# Patient Record
Sex: Male | Born: 1985 | Hispanic: Yes | Marital: Married | Smoking: Never smoker
Health system: Southern US, Community
[De-identification: ages and names within clinical notes are randomized; demographics above are authoritative.]

---

## 2016-08-06 ENCOUNTER — Emergency Department (HOSPITAL_COMMUNITY)
Admission: EM | Admit: 2016-08-06 | Discharge: 2016-08-06 | Disposition: A | Discharge status: Home / Self Care | Payer: Self-pay | Attending: Emergency Medicine | Admitting: Emergency Medicine

## 2016-08-06 ENCOUNTER — Emergency Department (HOSPITAL_COMMUNITY): Payer: Self-pay

## 2016-08-06 ENCOUNTER — Encounter (HOSPITAL_COMMUNITY): Payer: Self-pay

## 2016-08-06 DIAGNOSIS — K5792 Diverticulitis of intestine, part unspecified, without perforation or abscess without bleeding: Secondary | ICD-10-CM | POA: Insufficient documentation

## 2016-08-06 LAB — LIPASE, BLOOD: Lipase: 14 U/L (ref 11–51)

## 2016-08-06 LAB — CBC
HCT: 45.1 % (ref 39.0–52.0)
HEMOGLOBIN: 15.6 g/dL (ref 13.0–17.0)
MCH: 29.4 pg (ref 26.0–34.0)
MCHC: 34.6 g/dL (ref 30.0–36.0)
MCV: 85.1 fL (ref 78.0–100.0)
PLATELETS: 306 10*3/uL (ref 150–400)
RBC: 5.3 MIL/uL (ref 4.22–5.81)
RDW: 13 % (ref 11.5–15.5)
WBC: 9.7 10*3/uL (ref 4.0–10.5)

## 2016-08-06 LAB — COMPREHENSIVE METABOLIC PANEL
ALK PHOS: 90 U/L (ref 38–126)
ALT: 41 U/L (ref 17–63)
ANION GAP: 8 (ref 5–15)
AST: 30 U/L (ref 15–41)
Albumin: 4.4 g/dL (ref 3.5–5.0)
BUN: 14 mg/dL (ref 6–20)
CALCIUM: 9.5 mg/dL (ref 8.9–10.3)
CO2: 25 mmol/L (ref 22–32)
CREATININE: 0.84 mg/dL (ref 0.61–1.24)
Chloride: 105 mmol/L (ref 101–111)
Glucose, Bld: 100 mg/dL — ABNORMAL HIGH (ref 65–99)
Potassium: 4.1 mmol/L (ref 3.5–5.1)
Sodium: 138 mmol/L (ref 135–145)
TOTAL PROTEIN: 7.1 g/dL (ref 6.5–8.1)
Total Bilirubin: 0.9 mg/dL (ref 0.3–1.2)

## 2016-08-06 LAB — URINALYSIS, ROUTINE W REFLEX MICROSCOPIC
Bilirubin Urine: NEGATIVE
Glucose, UA: NEGATIVE mg/dL
HGB URINE DIPSTICK: NEGATIVE
Ketones, ur: NEGATIVE mg/dL
LEUKOCYTES UA: NEGATIVE
NITRITE: NEGATIVE
PROTEIN: NEGATIVE mg/dL
Specific Gravity, Urine: 1.02 (ref 1.005–1.030)
pH: 5 (ref 5.0–8.0)

## 2016-08-06 MED ORDER — IOPAMIDOL (ISOVUE-300) INJECTION 61%
INTRAVENOUS | Status: AC
  Administered 2016-08-06: 100 mL
  Filled 2016-08-06: qty 100

## 2016-08-06 MED ORDER — CIPROFLOXACIN HCL 500 MG PO TABS: 500.0000 mg | ORAL_TABLET | Freq: Two times a day (BID) | ORAL | 0 refills | Status: DC

## 2016-08-06 MED ORDER — ONDANSETRON 4 MG PO TBDP: 4.0000 mg | ORAL_TABLET | Freq: Three times a day (TID) | ORAL | 0 refills | Status: DC | PRN

## 2016-08-06 MED ORDER — HYDROMORPHONE HCL 1 MG/ML IJ SOLN
1.0000 mg | Freq: Once | INTRAMUSCULAR | Status: AC
  Administered 2016-08-06: 1 mg via INTRAVENOUS
  Filled 2016-08-06: qty 1

## 2016-08-06 MED ORDER — ONDANSETRON HCL 4 MG/2ML IJ SOLN
4.0000 mg | Freq: Once | INTRAMUSCULAR | Status: AC
  Administered 2016-08-06: 4 mg via INTRAVENOUS
  Filled 2016-08-06: qty 2

## 2016-08-06 MED ORDER — METRONIDAZOLE 500 MG PO TABS: 500.0000 mg | ORAL_TABLET | Freq: Two times a day (BID) | ORAL | 0 refills | Status: DC

## 2016-08-06 MED ORDER — OXYCODONE-ACETAMINOPHEN 5-325 MG PO TABS: 2.0000 | ORAL_TABLET | ORAL | 0 refills | Status: DC | PRN

## 2016-08-06 NOTE — Discharge Instructions (Signed)
Return if any problems.  Follow up with Gi for recheck in 2-3 weeks

## 2016-08-06 NOTE — ED Notes (Signed)
Pt transferred to CT.

## 2016-08-06 NOTE — ED Triage Notes (Signed)
Pt reports LLQ abdominal pain, "it feels like I'm constipated but I'm not." LBM last night; pain onset two days ago. Denies n/v.

## 2016-08-06 NOTE — ED Notes (Signed)
Pt asked for food. Per PA "not yet". Pt a/o, comfortable.

## 2016-08-06 NOTE — ED Provider Notes (Signed)
[facility]-EMERGENCY DEPT Provider Note   CSN: [id number] Arrival date & time: 08/06/16  0910     History   Chief Complaint Chief Complaint  Patient presents with  . Abdominal Pain    HPI Brandon Valentine is a 31 y.o. male.  The history is provided by the patient. No language interpreter was used.  Abdominal Pain   This is a new problem. The current episode started more than 2 days ago. The problem occurs constantly. The problem has been gradually worsening. The pain is associated with eating. The pain is located in the LLQ. The quality of the pain is aching. The pain is at a severity of 6/10. The pain is moderate. Pertinent negatives include anorexia, diarrhea, vomiting and constipation. Nothing aggravates the symptoms. Nothing relieves the symptoms. Past workup does not include GI consult. His past medical history does not include PUD.  Pt complains of pain in his left lower abdomen after eating fish on Sunday. Pt reports he feels constipated but is having normal bowel movements.  No fever or chills,   History reviewed. No pertinent past medical history.  There are no active problems to display for this patient.   History reviewed. No pertinent surgical history.     Home Medications    Prior to Admission medications   Not on File    Family History No family history on file.  Social History Social History  Substance Use Topics  . Smoking status: Never Smoker  . Smokeless tobacco: Never Used  . Alcohol use No     Allergies   Patient has no allergy information on record.   Review of Systems Review of Systems  Gastrointestinal: Positive for abdominal pain. Negative for anorexia, constipation, diarrhea and vomiting.  All other systems reviewed and are negative.    Physical Exam Updated Vital Signs BP 100/68   Pulse 77   Temp 98.5 F (36.9 C) (Oral)   Resp (!) 23   SpO2 97%   Physical Exam  Constitutional: He appears well-developed and  well-nourished.  HENT:  Head: Normocephalic.  Right Ear: External ear normal.  Left Ear: External ear normal.  Eyes: Conjunctivae are normal. Pupils are equal, round, and reactive to light.  Neck: Normal range of motion.  Cardiovascular: Normal rate and regular rhythm.   Pulmonary/Chest: Effort normal.  Abdominal: Soft. There is tenderness.  Musculoskeletal: Normal range of motion.  Neurological: He is alert.  Skin: Skin is warm.  Nursing note and vitals reviewed.    ED Treatments / Results  Labs (all labs ordered are listed, but only abnormal results are displayed) Labs Reviewed  COMPREHENSIVE METABOLIC PANEL - Abnormal; Notable for the following:       Result Value   Glucose, Bld 100 (*)    All other components within normal limits  LIPASE, BLOOD  CBC  URINALYSIS, ROUTINE W REFLEX MICROSCOPIC    EKG  EKG Interpretation None       Radiology Ct Abdomen Pelvis W Contrast  Result Date: 08/06/2016 CLINICAL DATA:  Left lower quadrant pain for 2 days. EXAM: CT ABDOMEN AND PELVIS WITH CONTRAST TECHNIQUE: Multidetector CT imaging of the abdomen and pelvis was performed using the standard protocol following bolus administration of intravenous contrast. CONTRAST:  ISOVUE-300 IOPAMIDOL (ISOVUE-300) INJECTION 61% COMPARISON:  None. FINDINGS: Lower Chest: No acute findings. Hepatobiliary:  No masses identified. Gallbladder is unremarkable. Pancreas:  No mass or inflammatory changes. Spleen: Within normal limits in size and appearance. Adrenals/Urinary Tract: No masses identified. No evidence  of hydronephrosis. Stomach/Bowel: Mild diverticulosis with associated pericolonic soft tissue stranding is seen involving the proximal sigmoid colon, consistent with mild diverticulitis. No evidence of abscess or free fluid. No evidence of bowel obstruction. Vascular/Lymphatic: No pathologically enlarged lymph nodes. No abdominal aortic aneurysm. Reproductive:  No mass or other significant  abnormality. Other:  None. Musculoskeletal: No suspicious bone lesions identified. Bilateral L5 spondylolysis noted, without spondylolisthesis. IMPRESSION: Mild diverticulitis of the proximal sigmoid colon. No evidence of abscess or other complication. Electronically Signed   By: Myles Rosenthal M.D.   On: 08/06/2016 13:08    Procedures Procedures (including critical care time)  Medications Ordered in ED Medications  iopamidol (ISOVUE-300) 61 % injection (100 mLs  Contrast Given 08/06/16 1251)     Initial Impression / Assessment and Plan / ED Course  I have reviewed the triage vital signs and the nursing notes.  Pertinent labs & imaging results that were available during my care of the patient were reviewed by me and considered in my medical decision making (see chart for details).       Final Clinical Impressions(s) / ED Diagnoses   Final diagnoses:  Diverticulitis of intestine without perforation or abscess without bleeding, unspecified part of intestinal tract    New Prescriptions New Prescriptions   CIPROFLOXACIN (CIPRO) 500 MG TABLET    Take 1 tablet (500 mg total) by mouth 2 (two) times daily.   METRONIDAZOLE (FLAGYL) 500 MG TABLET    Take 1 tablet (500 mg total) by mouth 2 (two) times daily.   ONDANSETRON (ZOFRAN ODT) 4 MG DISINTEGRATING TABLET    Take 1 tablet (4 mg total) by mouth every 8 (eight) hours as needed for nausea or vomiting.   OXYCODONE-ACETAMINOPHEN (PERCOCET/ROXICET) 5-325 MG TABLET    Take 2 tablets by mouth every 4 (four) hours as needed for severe pain.  An After Visit Summary was printed and given to the patient.    Lonia Skinner [place], PA-C 08/06/16 1352    Mancel Bale, MD 08/10/16 [phone]

## 2018-11-04 IMAGING — CT CT ABD-PELV W/ CM
2 of 4 series · 16 of 46 positions shown, 18 images · IV contrast (Omni 300)
Comparison: None.

CLINICAL DATA: Left lower quadrant pain for 2 days.

EXAM:
CT ABDOMEN AND PELVIS WITH CONTRAST
TECHNIQUE: Multidetector CT imaging of the abdomen and pelvis was performed
using the standard protocol following bolus administration of
intravenous contrast.
CONTRAST:  100mL 20FKCZ-5MM IOPAMIDOL (20FKCZ-5MM) INJECTION 61%

[Series 3: a/p w/ 5mm · axial · 0.88mm/px · z∈[-478,-28]mm · 13 of 100 slices shown, 15 images]
[im 5/100  soft-tissue]
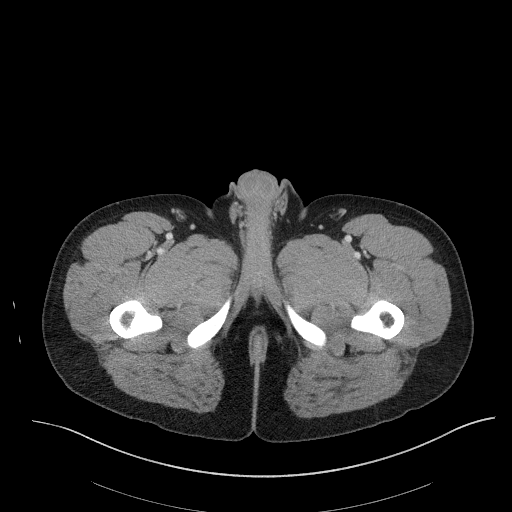
[im 5/100  bone]
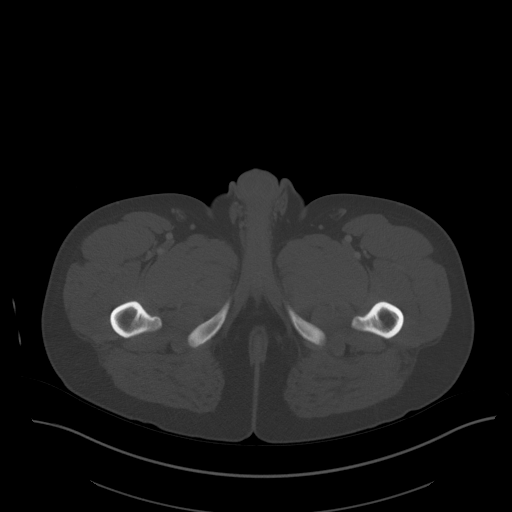
[im 15/100  soft-tissue]
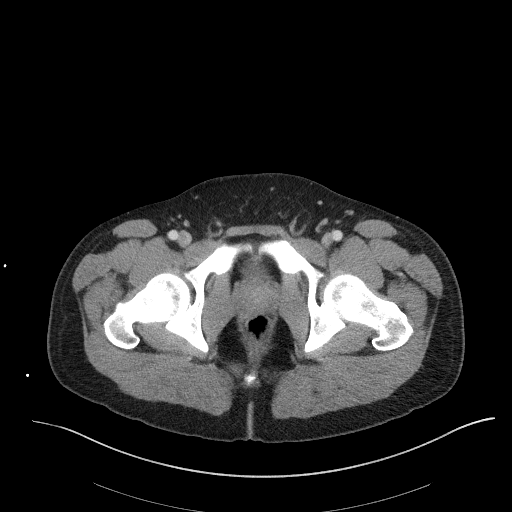
[im 19/100  soft-tissue]
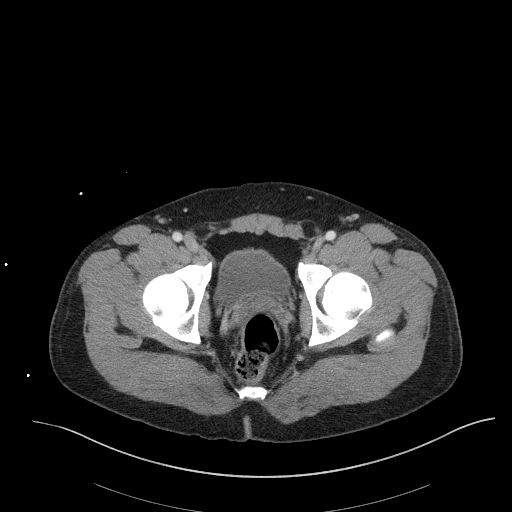
[im 29/100  soft-tissue]
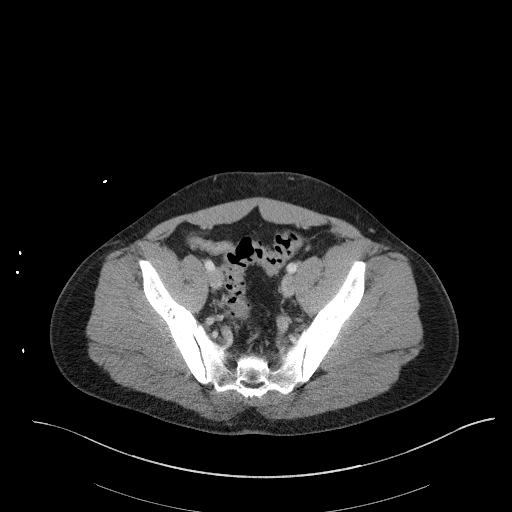
[im 34/100  soft-tissue]
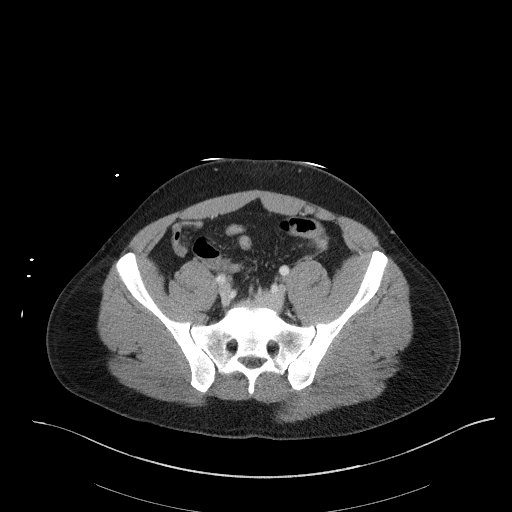
[im 43/100  soft-tissue]
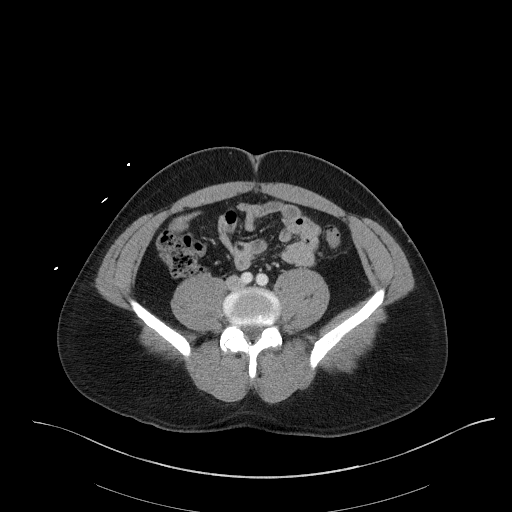
[im 52/100  soft-tissue]
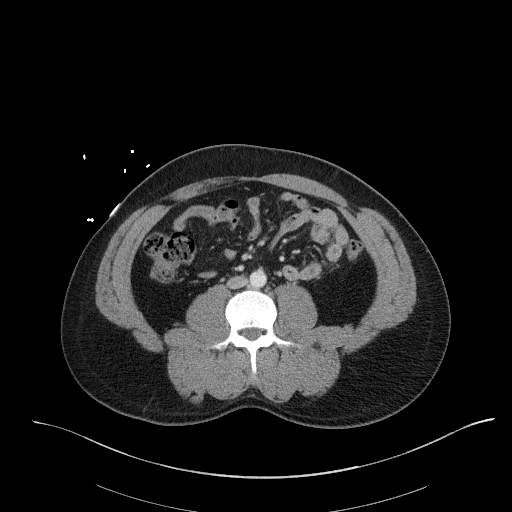
[im 57/100  soft-tissue]
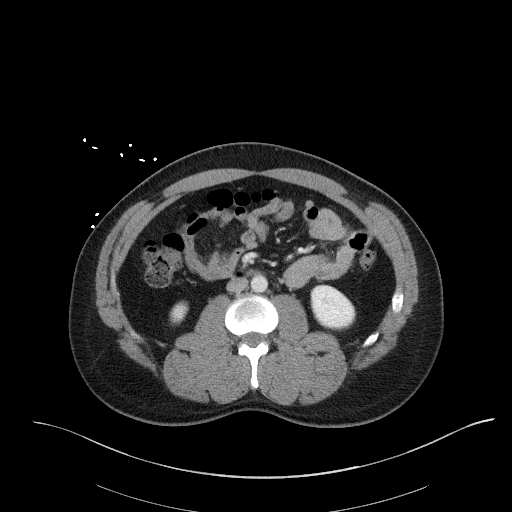
[im 67/100  soft-tissue]
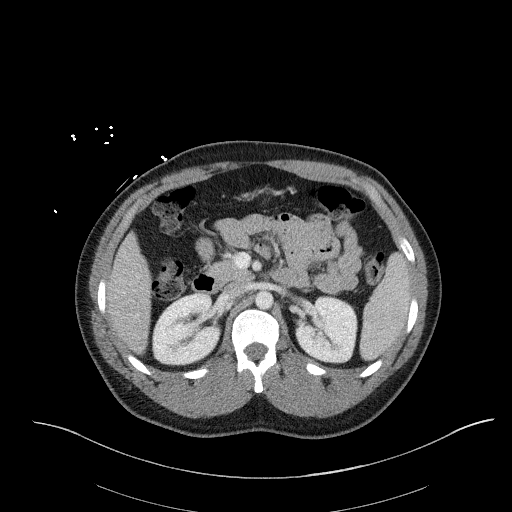
[im 67/100  bone]
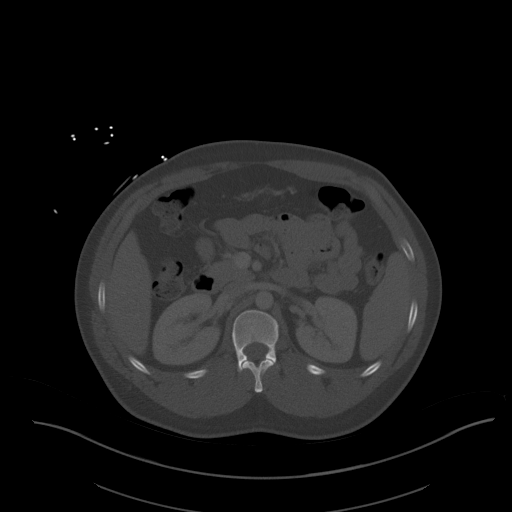
[im 71/100  soft-tissue]
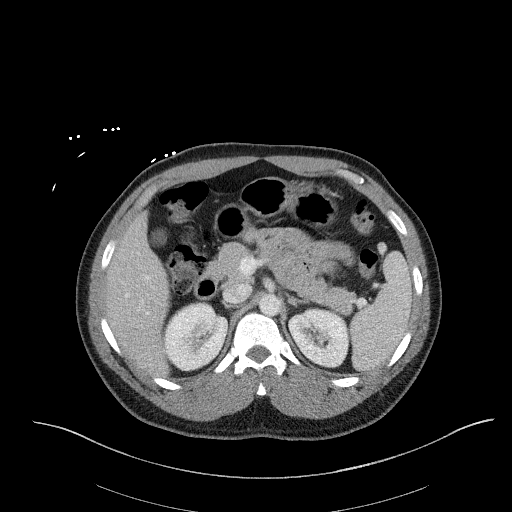
[im 81/100  soft-tissue]
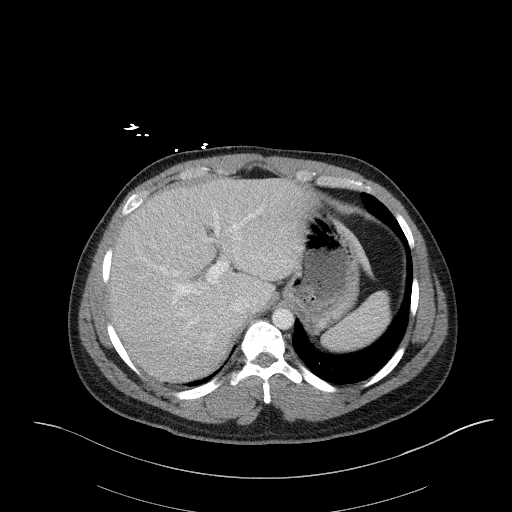
[im 85/100  soft-tissue]
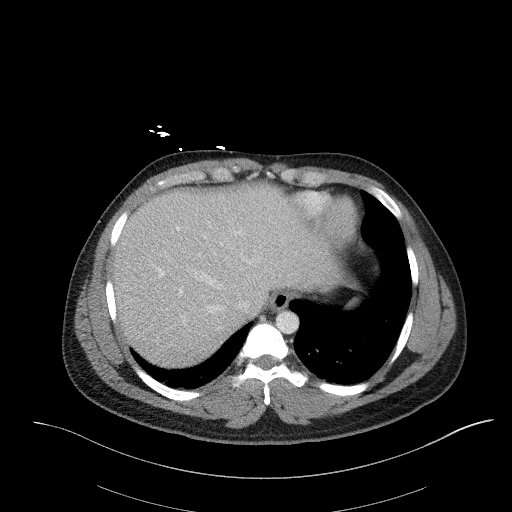
[im 95/100  soft-tissue]
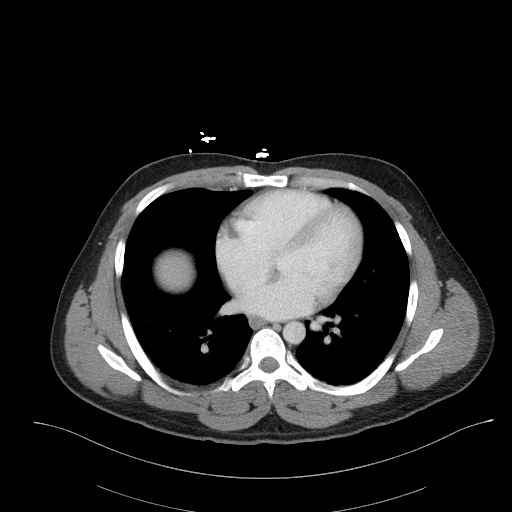

[Series 6: a/p w/ cor · coronal · 0.77mm/px · 3 of 146 slices shown]
[im 49/146  soft-tissue]
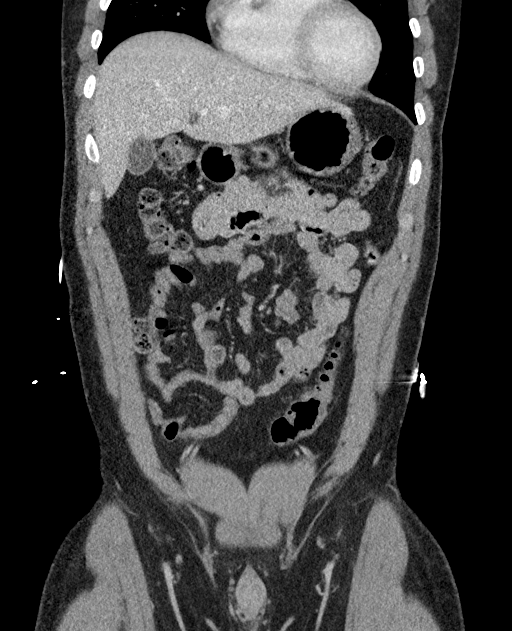
[im 65/146  soft-tissue]
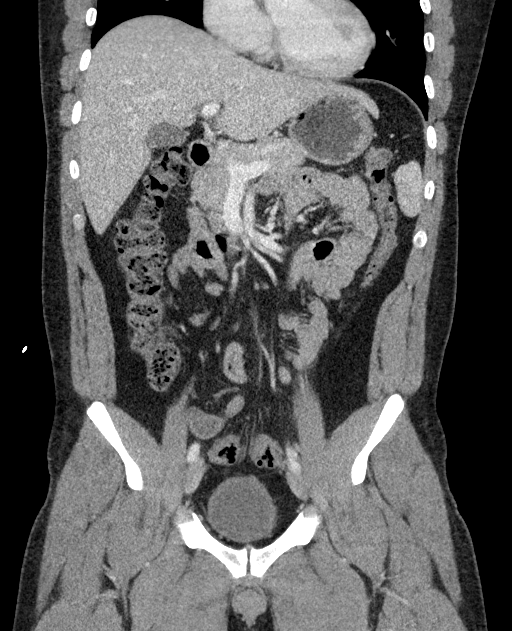
[im 81/146  soft-tissue]
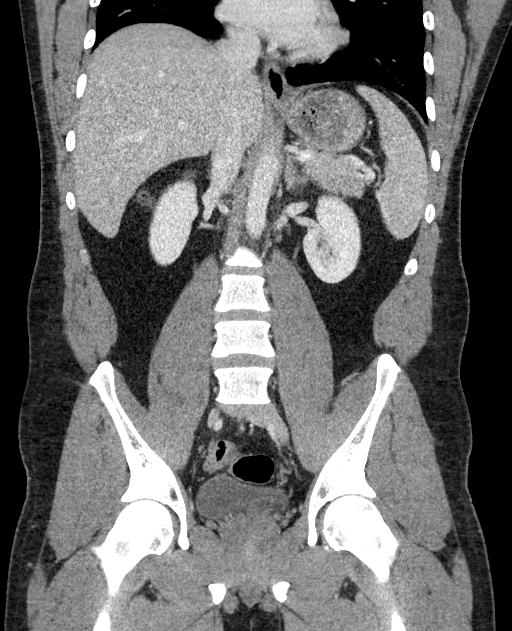

[16 of 46 positions shown; findings below may reference images not displayed]

FINDINGS: Lower Chest: No acute findings.

Hepatobiliary:  No masses identified. Gallbladder is unremarkable.

Pancreas:  No mass or inflammatory changes.

Spleen: Within normal limits in size and appearance.

Adrenals/Urinary Tract: No masses identified. No evidence of
hydronephrosis.

Stomach/Bowel: Mild diverticulosis with associated pericolonic soft
tissue stranding is seen involving the proximal sigmoid colon,
consistent with mild diverticulitis. No evidence of abscess or free
fluid. No evidence of bowel obstruction.

Vascular/Lymphatic: No pathologically enlarged lymph nodes. No
abdominal aortic aneurysm.

Reproductive:  No mass or other significant abnormality.

Other:  None.

Musculoskeletal: No suspicious bone lesions identified. Bilateral L5
spondylolysis noted, without spondylolisthesis.
IMPRESSION: Mild diverticulitis of the proximal sigmoid colon. No evidence of
abscess or other complication.

## 2021-01-31 ENCOUNTER — Ambulatory Visit: Payer: Self-pay | Admitting: *Deleted

## 2021-01-31 NOTE — Telephone Encounter (Signed)
Reason for Disposition  Severe itching  Answer Assessment - Initial Assessment Questions 1. APPEARANCE of RASH: "Describe the rash."      Red and bumpy, itches 2. LOCATION: "Where is the rash located?"      Inner thighs near scrotum 3. NUMBER: "How many spots are there?"       4. SIZE: "How big are the spots?" (Inches, centimeters or compare to size of a coin)      tiny 5. ONSET: "When did the rash start?"      2 weeks ago. 6. ITCHING: "Does the rash itch?" If Yes, ask: "How bad is the itch?"  (Scale 0-10; or none, mild, moderate, severe)     Mild today 7. PAIN: "Does the rash hurt?" If Yes, ask: "How bad is the pain?"  (Scale 0-10; or none, mild, moderate, severe)    - NONE (0): no pain    - MILD (1-3): doesn't interfere with normal activities     - MODERATE (4-7): interferes with normal activities or awakens from sleep     - SEVERE (8-10): excruciating pain, unable to do any normal activities     5-6 8. OTHER SYMPTOMS: "Do you have any other symptoms?" (e.g., fever)     no 9. PREGNANCY: "Is there any chance you are pregnant?" "When was your last menstrual period?"     na  Protocols used: Rash or Redness - Localized-A-AH, Penis and Scrotum Symptoms-A-AH

## 2021-04-04 ENCOUNTER — Ambulatory Visit: Payer: Self-pay | Admitting: Physician Assistant

## 2022-08-15 ENCOUNTER — Other Ambulatory Visit: Payer: Self-pay

## 2022-08-15 ENCOUNTER — Emergency Department (HOSPITAL_BASED_OUTPATIENT_CLINIC_OR_DEPARTMENT_OTHER)
Admission: EM | Admit: 2022-08-15 | Discharge: 2022-08-15 | Disposition: A | Discharge status: Home / Self Care | Payer: Self-pay | Attending: Emergency Medicine | Admitting: Emergency Medicine

## 2022-08-15 ENCOUNTER — Encounter (HOSPITAL_BASED_OUTPATIENT_CLINIC_OR_DEPARTMENT_OTHER): Payer: Self-pay | Admitting: Emergency Medicine

## 2022-08-15 DIAGNOSIS — T7840XA Allergy, unspecified, initial encounter: Secondary | ICD-10-CM | POA: Insufficient documentation

## 2022-08-15 MED ORDER — FAMOTIDINE IN NACL 20-0.9 MG/50ML-% IV SOLN
20.0000 mg | Freq: Once | INTRAVENOUS | Status: AC
  Administered 2022-08-15: 20 mg via INTRAVENOUS
  Filled 2022-08-15: qty 50

## 2022-08-15 MED ORDER — PREDNISONE 10 MG (21) PO TBPK: ORAL_TABLET | ORAL | 0 refills | Status: AC

## 2022-08-15 MED ORDER — EPINEPHRINE 0.3 MG/0.3ML IJ SOAJ
0.3000 mg | Freq: Once | INTRAMUSCULAR | Status: AC
  Administered 2022-08-15: 0.3 mg via INTRAMUSCULAR
  Filled 2022-08-15: qty 0.3

## 2022-08-15 MED ORDER — METHYLPREDNISOLONE SODIUM SUCC 125 MG IJ SOLR
125.0000 mg | Freq: Once | INTRAMUSCULAR | Status: AC
  Administered 2022-08-15: 125 mg via INTRAVENOUS
  Filled 2022-08-15: qty 2

## 2022-08-15 MED ORDER — SODIUM CHLORIDE 0.9 % IV BOLUS
1000.0000 mL | Freq: Once | INTRAVENOUS | Status: AC
  Administered 2022-08-15: 1000 mL via INTRAVENOUS

## 2022-08-15 MED ORDER — DIPHENHYDRAMINE HCL 50 MG/ML IJ SOLN
25.0000 mg | Freq: Once | INTRAMUSCULAR | Status: AC
  Administered 2022-08-15: 25 mg via INTRAVENOUS
  Filled 2022-08-15: qty 1

## 2022-08-15 NOTE — ED Provider Notes (Signed)
[place] EMERGENCY DEPARTMENT AT [facility]  Provider Note  CSN: [id number] Arrival date & time: 08/15/22 0114  History Chief Complaint  Patient presents with   Pruritis   Hypotension    Brandon Valentine is a 37 y.o. male with no significant PMH reports he was at a friend's house earlier tonight. On the way home, he felt a sharp sting in his back that he thought might be a bee and then broke out in an itchy rash all over. He took his shirt off and did not see any insects. He reports he drank a mixed drink at his friends that had some juice he was not familiar with and suspects he may also have had a reaction to that. While in triage he became lightheaded, slumped back in the chair and vomited. BP was low then, but recovered quickly on arrival to exam room and lying supine. He reports he is feeling much better now.    Home Medications Prior to Admission medications   Medication Sig Start Date End Date Taking? Authorizing Provider  predniSONE (STERAPRED UNI-PAK 21 TAB) 10 MG (21) TBPK tablet  Tabs, 6 day taper. Use as directed 08/15/22  Yes Pollyann Savoy, MD     Allergies    Patient has no known allergies.   Review of Systems   Review of Systems Please see HPI for pertinent positives and negatives  Physical Exam BP 108/78   Pulse 65   Temp 97.8 F (36.6 C) (Oral)   Resp 17   SpO2 95%   Physical Exam Vitals and nursing note reviewed.  Constitutional:      Appearance: Normal appearance.  HENT:     Head: Normocephalic and atraumatic.     Nose: Nose normal.     Mouth/Throat:     Mouth: Mucous membranes are moist.     Comments: No angioedema of tongue or lips Eyes:     Extraocular Movements: Extraocular movements intact.     Conjunctiva/sclera: Conjunctivae normal.  Cardiovascular:     Rate and Rhythm: Normal rate.  Pulmonary:     Effort: Pulmonary effort is normal.     Breath sounds: Normal breath sounds. No stridor. No wheezing.   Abdominal:     General: Abdomen is flat.     Palpations: Abdomen is soft.     Tenderness: There is no abdominal tenderness.  Musculoskeletal:        General: No swelling. Normal range of motion.     Cervical back: Neck supple.  Skin:    General: Skin is warm and dry.     Findings: Rash (occasional hives on back) present.  Neurological:     General: No focal deficit present.     Mental Status: He is alert.  Psychiatric:        Mood and Affect: Mood normal.     ED Results / Procedures / Treatments   EKG EKG Interpretation  Date/Time:  Friday August 15 2022 01:36:21 EDT Ventricular Rate:  85 PR Interval:  186 QRS Duration: 94 QT Interval:  347 QTC Calculation: 413 R Axis:   -70 Text Interpretation: Sinus rhythm LAD, consider left anterior fascicular block Abnormal R-wave progression, late transition ST elev, probable normal early repol pattern No old tracing to compare Confirmed by Susy Frizzle [phone]) on 08/15/2022 1:44:25 AM  Procedures Procedures  Medications Ordered in the ED Medications  methylPREDNISolone sodium succinate (SOLU-MEDROL) 125 mg/2 mL injection 125 mg (125 mg Intravenous Given 08/15/22 0147)  diphenhydrAMINE (  BENADRYL) injection 25 mg (25 mg Intravenous Given 08/15/22 0148)  famotidine (PEPCID) IVPB 20 mg premix (0 mg Intravenous Stopped 08/15/22 0205)  EPINEPHrine (EPI-PEN) injection 0.3 mg (0.3 mg Intramuscular Given 08/15/22 0144)  sodium chloride 0.9 % bolus 1,000 mL (0 mLs Intravenous Stopped 08/15/22 0330)    Initial Impression and Plan  Patient here with allergic reaction to unknown allergen. Had an episode of near syncope and hypotension in triage could be anaphylactic, although improved rapidly. Will treat with steroids, antihistamines and epi. Watch in the ED for recurrence.   ED Course   Clinical Course as of 08/15/22 0540  Fri Aug 15, 2022  1478 Patient observed in the ED for 4 hours post EpiPen, continues to rest comfortably. No further  episodes of hypotension or signs of worsening reaction. Plan D/C with Rx for Prednisone. PCP follow up, RTED for any other concerns.   [CS]    Clinical Course User Index [CS] Pollyann Savoy, MD     MDM Rules/Calculators/A&P Medical Decision Making Given presenting complaint, I considered that admission might be necessary. After review of results from ED lab and/or imaging studies, admission to the hospital is not indicated at this time.    Problems Addressed: Allergic reaction, initial encounter: acute illness or injury  Risk Prescription drug management. Decision regarding hospitalization.  Critical Care Total time providing critical care: 35 minutes     Final Clinical Impression(s) / ED Diagnoses Final diagnoses:  Allergic reaction, initial encounter    Rx / DC Orders ED Discharge Orders          Ordered    predniSONE (STERAPRED UNI-PAK 21 TAB) 10 MG (21) TBPK tablet        08/15/22 0539             Pollyann Savoy, MD 08/15/22 [phone]

## 2022-08-15 NOTE — ED Triage Notes (Signed)
Around 12:30 started with itching on back of neck and lower body. Had mixed drink at friends house, may be allergic to the juice in it.   Patchy red splotches on back noted during triage.  No sob or airway issue   Started to feel dizzy during triage, vomiting
